# Patient Record
Sex: Male | Born: 2000 | Race: White | Hispanic: No | Marital: Single | Smoking: Never smoker
Health system: Southern US, Community
[De-identification: ages and names within clinical notes are randomized; demographics above are authoritative.]

## PROBLEM LIST (undated history)

## (undated) DIAGNOSIS — F329 Major depressive disorder, single episode, unspecified: Secondary | ICD-10-CM

## (undated) DIAGNOSIS — F32A Depression, unspecified: Secondary | ICD-10-CM

## (undated) DIAGNOSIS — F419 Anxiety disorder, unspecified: Secondary | ICD-10-CM

## (undated) HISTORY — DX: Depression, unspecified: F32.A

## (undated) HISTORY — DX: Major depressive disorder, single episode, unspecified: F32.9

## (undated) HISTORY — DX: Anxiety disorder, unspecified: F41.9

---
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
## 2021-08-15 DIAGNOSIS — M19079 Primary osteoarthritis, unspecified ankle and foot: Secondary | ICD-10-CM | POA: Diagnosis not present

## 2021-08-15 DIAGNOSIS — M109 Gout, unspecified: Secondary | ICD-10-CM | POA: Diagnosis not present

## 2021-08-15 DIAGNOSIS — M79672 Pain in left foot: Secondary | ICD-10-CM | POA: Diagnosis not present

## 2021-09-19 ENCOUNTER — Encounter: Payer: Self-pay | Admitting: Family Medicine

## 2021-09-19 ENCOUNTER — Ambulatory Visit: Payer: BC Managed Care – PPO | Admitting: Family Medicine

## 2021-09-19 VITALS — BP 137/77 | HR 98 | Temp 98.0°F | Ht 75.89 in | Wt 321.0 lb

## 2021-09-19 DIAGNOSIS — M10072 Idiopathic gout, left ankle and foot: Secondary | ICD-10-CM

## 2021-09-19 MED ORDER — METHYLPREDNISOLONE ACETATE 80 MG/ML IJ SUSP
80.0000 mg | Freq: Once | INTRAMUSCULAR | Status: AC
  Administered 2021-09-19: 80 mg via INTRAMUSCULAR

## 2021-09-19 MED ORDER — COLCHICINE 0.6 MG PO TABS: ORAL_TABLET | ORAL | 0 refills | Status: AC

## 2021-09-19 NOTE — Progress Notes (Signed)
   Assessment & Plan:  1. Acute idiopathic gout involving toe of left foot Education provided on a low purine diet.  Advised he may use colchicine for gout flares in the future but that if he is having more than 2-3/year he needs to be on medication to control uric acid level (allopurinol).  - methylPREDNISolone acetate (DEPO-MEDROL) injection 80 mg - colchicine 0.6 MG tablet; Take 1.2 mg (2 tablets) by mouth at the first sign of a gout flare followed by 0.6 mg (1 tablet) one hour later.  Dispense: 15 tablet; Refill: 0   Follow up plan: Return if symptoms worsen or fail to improve.  Deliah Boston, MSN, APRN, FNP-C [facility]  Subjective:   Patient ID: David Ponce, male    DOB: [birth year], 21 y.o.   MRN: [id number]  HPI: David Ponce is a 21 y.o. male presenting on 09/19/2021 for Gout (Left big toe x 1 month )  Patient reports left great toe pain, swelling, and redness. This initially started a month ago at which time he was seen at urgent care and treated for gout. He reports the symptoms resolved for 2-3 days but then returned, although not as bad as initially. He has decreased the amount of soda he drinks. No previous history of gout. No family history of gout.   ROS: Negative unless specifically indicated above in HPI.   Relevant past medical history reviewed and updated as indicated.   Allergies and medications reviewed and updated.   Current Outpatient Medications:    famotidine (PEPCID) 20 MG tablet, Take 1 tablet (20 mg total) by mouth 2 (two) times daily., Disp: 60 tablet, Rfl: 2  No Known Allergies  Objective:   BP 137/77   Pulse 98   Temp 98 F (36.7 C) (Temporal)   Ht 6' 3.89" (1.928 m)   Wt (!) 321 lb (145.6 kg)   SpO2 99%   BMI 39.19 kg/m    Physical Exam Vitals reviewed.  Constitutional:      General: He is not in acute distress.    Appearance: Normal appearance. He is not ill-appearing, toxic-appearing or diaphoretic.   HENT:     Head: Normocephalic and atraumatic.  Eyes:     General: No scleral icterus.       Right eye: No discharge.        Left eye: No discharge.     Conjunctiva/sclera: Conjunctivae normal.  Cardiovascular:     Rate and Rhythm: Normal rate.  Pulmonary:     Effort: Pulmonary effort is normal. No respiratory distress.  Musculoskeletal:        General: Normal range of motion.     Cervical back: Normal range of motion.  Feet:     Left foot:     Skin integrity: Erythema (left first MTP joint) and warmth (left first MTP joint) present.     Comments: Swelling to left first MTP joint Skin:    General: Skin is warm and dry.  Neurological:     Mental Status: He is alert and oriented to person, place, and time. Mental status is at baseline.  Psychiatric:        Mood and Affect: Mood normal.        Behavior: Behavior normal.        Thought Content: Thought content normal.        Judgment: Judgment normal.

## 2023-03-27 DIAGNOSIS — L2389 Allergic contact dermatitis due to other agents: Secondary | ICD-10-CM | POA: Diagnosis not present

## 2023-06-07 ENCOUNTER — Other Ambulatory Visit: Payer: Self-pay | Admitting: Family Medicine

## 2023-06-07 DIAGNOSIS — M10072 Idiopathic gout, left ankle and foot: Secondary | ICD-10-CM

## 2023-06-08 NOTE — Telephone Encounter (Signed)
 [facility] will NTBS, there are openings today with our DOD provider.
